# Patient Record
Sex: Male | Born: 1950 | Marital: Married
Health system: Southern US, Community
[De-identification: ages and names within clinical notes are randomized; demographics above are authoritative.]

---

## 2011-06-29 ENCOUNTER — Ambulatory Visit: Payer: Self-pay | Admitting: Emergency Medicine

## 2011-06-29 LAB — CBC WITH DIFFERENTIAL/PLATELET
Eosinophil %: 2.1 %
HCT: 35.7 % — ABNORMAL LOW (ref 40.0–52.0)
HGB: 11.9 g/dL — ABNORMAL LOW (ref 13.0–18.0)
Lymphocyte %: 27 %
MCHC: 33.5 g/dL (ref 32.0–36.0)
MCV: 92 fL (ref 80–100)
Monocyte #: 0.6 x10 3/mm (ref 0.2–1.0)
Neutrophil #: 5.2 10*3/uL (ref 1.4–6.5)
Neutrophil %: 62.8 %
Platelet: 259 10*3/uL (ref 150–440)
RBC: 3.86 10*6/uL — ABNORMAL LOW (ref 4.40–5.90)
WBC: 8.3 10*3/uL (ref 3.8–10.6)

## 2011-06-29 LAB — HEPATIC FUNCTION PANEL A (ARMC)
Albumin: 3.5 g/dL (ref 3.4–5.0)
Alkaline Phosphatase: 91 U/L (ref 50–136)
Bilirubin, Direct: <0.1 mg/dL (ref 0.00–0.20)
Bilirubin,Total: 0.2 mg/dL (ref 0.2–1.0)
SGOT(AST): 16 U/L (ref 15–37)
SGPT (ALT): 20 U/L
Total Protein: 7.3 g/dL (ref 6.4–8.2)

## 2011-07-02 ENCOUNTER — Inpatient Hospital Stay: Payer: Self-pay | Admitting: Emergency Medicine

## 2011-07-02 LAB — HEMOGLOBIN
HGB: 14 g/dL (ref 13.0–18.0)
HGB: 13.8 g/dL (ref 13.0–18.0)

## 2011-07-03 LAB — BASIC METABOLIC PANEL
Anion Gap: 8 (ref 7–16)
BUN: 32 mg/dL — ABNORMAL HIGH (ref 7–18)
Calcium, Total: 7.8 mg/dL — ABNORMAL LOW (ref 8.5–10.1)
Chloride: 111 mmol/L — ABNORMAL HIGH (ref 98–107)
Co2: 22 mmol/L (ref 21–32)
EGFR (Non-African Amer.): 21 — ABNORMAL LOW
Glucose: 87 mg/dL (ref 65–99)
Potassium: 5.3 mmol/L — ABNORMAL HIGH (ref 3.5–5.1)
Sodium: 141 mmol/L (ref 136–145)

## 2011-07-03 LAB — HEMOGLOBIN
HGB: 13.7 g/dL (ref 13.0–18.0)
HGB: 12.5 g/dL — ABNORMAL LOW (ref 13.0–18.0)

## 2011-07-04 LAB — CBC WITH DIFFERENTIAL/PLATELET
Basophil #: 0 10*3/uL (ref 0.0–0.1)
Basophil %: 0.2 %
Eosinophil #: 0 10*3/uL (ref 0.0–0.7)
HCT: 32.3 % — ABNORMAL LOW (ref 40.0–52.0)
HGB: 10.9 g/dL — ABNORMAL LOW (ref 13.0–18.0)
Lymphocyte %: 6.1 %
MCH: 31 pg (ref 26.0–34.0)
MCHC: 33.6 g/dL (ref 32.0–36.0)
MCV: 92 fL (ref 80–100)
Monocyte %: 9.1 %
Neutrophil #: 11.8 10*3/uL — ABNORMAL HIGH (ref 1.4–6.5)
Platelet: 190 10*3/uL (ref 150–440)
RBC: 3.51 10*6/uL — ABNORMAL LOW (ref 4.40–5.90)
WBC: 13.9 10*3/uL — ABNORMAL HIGH (ref 3.8–10.6)

## 2011-07-04 LAB — BASIC METABOLIC PANEL
Calcium, Total: 8.4 mg/dL — ABNORMAL LOW (ref 8.5–10.1)
Chloride: 107 mmol/L (ref 98–107)
Co2: 21 mmol/L (ref 21–32)
EGFR (African American): 45 — ABNORMAL LOW
EGFR (Non-African Amer.): 39 — ABNORMAL LOW
Sodium: 140 mmol/L (ref 136–145)

## 2011-07-05 LAB — BASIC METABOLIC PANEL
Calcium, Total: 8.7 mg/dL (ref 8.5–10.1)
Chloride: 107 mmol/L (ref 98–107)
Co2: 24 mmol/L (ref 21–32)
Creatinine: 1.21 mg/dL (ref 0.60–1.30)
EGFR (African American): >60
EGFR (Non-African Amer.): >60
Potassium: 3.9 mmol/L (ref 3.5–5.1)
Sodium: 140 mmol/L (ref 136–145)

## 2011-07-05 LAB — CBC WITH DIFFERENTIAL/PLATELET
Eosinophil %: 0.5 %
Lymphocyte %: 11.2 %
MCH: 30.5 pg (ref 26.0–34.0)
MCV: 92 fL (ref 80–100)
Monocyte #: 0.8 x10 3/mm (ref 0.2–1.0)
Neutrophil #: 9 10*3/uL — ABNORMAL HIGH (ref 1.4–6.5)
Neutrophil %: 80.9 %
Platelet: 184 10*3/uL (ref 150–440)
RBC: 3.02 10*6/uL — ABNORMAL LOW (ref 4.40–5.90)
RDW: 13.3 % (ref 11.5–14.5)
WBC: 11.2 10*3/uL — ABNORMAL HIGH (ref 3.8–10.6)

## 2011-07-08 LAB — PATHOLOGY REPORT

## 2011-07-09 LAB — CBC WITH DIFFERENTIAL/PLATELET
Basophil %: 0.5 %
Eosinophil %: 0.3 %
HCT: 28.6 % — ABNORMAL LOW (ref 40.0–52.0)
Lymphocyte #: 0.9 10*3/uL — ABNORMAL LOW (ref 1.0–3.6)
MCH: 30.5 pg (ref 26.0–34.0)
MCHC: 33.4 g/dL (ref 32.0–36.0)
MCV: 91 fL (ref 80–100)
Monocyte #: 1.4 x10 3/mm — ABNORMAL HIGH (ref 0.2–1.0)
Monocyte %: 12.5 %
Platelet: 398 10*3/uL (ref 150–440)
RBC: 3.13 10*6/uL — ABNORMAL LOW (ref 4.40–5.90)
RDW: 13.1 % (ref 11.5–14.5)
WBC: 10.9 10*3/uL — ABNORMAL HIGH (ref 3.8–10.6)

## 2011-07-09 LAB — BASIC METABOLIC PANEL
Anion Gap: 10 (ref 7–16)
BUN: 24 mg/dL — ABNORMAL HIGH (ref 7–18)
Calcium, Total: 8.5 mg/dL (ref 8.5–10.1)
Chloride: 105 mmol/L (ref 98–107)
EGFR (African American): >60
Glucose: 116 mg/dL — ABNORMAL HIGH (ref 65–99)
Sodium: 144 mmol/L (ref 136–145)

## 2011-07-12 LAB — CBC WITH DIFFERENTIAL/PLATELET
Basophil #: 0.1 10*3/uL (ref 0.0–0.1)
Basophil %: 0.6 %
Lymphocyte #: 1.2 10*3/uL (ref 1.0–3.6)
MCV: 91 fL (ref 80–100)
Monocyte %: 13.1 %
Neutrophil #: 7.1 10*3/uL — ABNORMAL HIGH (ref 1.4–6.5)
Neutrophil %: 72 %
Platelet: 464 10*3/uL — ABNORMAL HIGH (ref 150–440)
RBC: 2.94 10*6/uL — ABNORMAL LOW (ref 4.40–5.90)
WBC: 9.9 10*3/uL (ref 3.8–10.6)

## 2011-07-12 LAB — BASIC METABOLIC PANEL
Anion Gap: 8 (ref 7–16)
BUN: 13 mg/dL (ref 7–18)
Chloride: 110 mmol/L — ABNORMAL HIGH (ref 98–107)
Co2: 26 mmol/L (ref 21–32)
Creatinine: 1.05 mg/dL (ref 0.60–1.30)
EGFR (African American): >60
EGFR (Non-African Amer.): >60
Osmolality: 288 (ref 275–301)
Potassium: 2.7 mmol/L — ABNORMAL LOW (ref 3.5–5.1)
Sodium: 144 mmol/L (ref 136–145)

## 2011-08-29 LAB — COMPREHENSIVE METABOLIC PANEL
Albumin: 3 g/dL — ABNORMAL LOW (ref 3.4–5.0)
Alkaline Phosphatase: 101 U/L (ref 50–136)
Anion Gap: 7 (ref 7–16)
BUN: 17 mg/dL (ref 7–18)
Calcium, Total: 9 mg/dL (ref 8.5–10.1)
EGFR (African American): >60
EGFR (Non-African Amer.): 53 — ABNORMAL LOW
Glucose: 114 mg/dL — ABNORMAL HIGH (ref 65–99)
Osmolality: 289 (ref 275–301)
SGOT(AST): 17 U/L (ref 15–37)
Sodium: 144 mmol/L (ref 136–145)

## 2011-08-29 LAB — CBC WITH DIFFERENTIAL/PLATELET
Basophil #: 0 10*3/uL (ref 0.0–0.1)
Eosinophil #: 0 10*3/uL (ref 0.0–0.7)
HCT: 29.2 % — ABNORMAL LOW (ref 40.0–52.0)
HGB: 9.7 g/dL — ABNORMAL LOW (ref 13.0–18.0)
Lymphocyte #: 1.2 10*3/uL (ref 1.0–3.6)
MCV: 88 fL (ref 80–100)
Monocyte #: 0.8 x10 3/mm (ref 0.2–1.0)
Platelet: 427 10*3/uL (ref 150–440)
RBC: 3.31 10*6/uL — ABNORMAL LOW (ref 4.40–5.90)
WBC: 22 10*3/uL — ABNORMAL HIGH (ref 3.8–10.6)

## 2011-08-30 ENCOUNTER — Inpatient Hospital Stay: Payer: Self-pay | Admitting: Emergency Medicine

## 2011-08-30 LAB — URINALYSIS, COMPLETE
Bacteria: NONE SEEN
Blood: NEGATIVE
Glucose,UR: NEGATIVE mg/dL (ref 0–75)
Ketone: NEGATIVE
Ph: 5 (ref 4.5–8.0)
Protein: NEGATIVE
RBC,UR: <1 /HPF (ref 0–5)
Specific Gravity: 1.026 (ref 1.003–1.030)
Squamous Epithelial: NONE SEEN

## 2011-08-31 LAB — CLOSTRIDIUM DIFFICILE BY PCR

## 2011-09-01 LAB — CBC WITH DIFFERENTIAL/PLATELET
Basophil #: 0.1 10*3/uL (ref 0.0–0.1)
Eosinophil #: 0.2 10*3/uL (ref 0.0–0.7)
HCT: 24.2 % — ABNORMAL LOW (ref 40.0–52.0)
Lymphocyte #: 2.1 10*3/uL (ref 1.0–3.6)
MCHC: 33.7 g/dL (ref 32.0–36.0)
MCV: 89 fL (ref 80–100)
Monocyte #: 0.6 x10 3/mm (ref 0.2–1.0)
Neutrophil #: 4.8 10*3/uL (ref 1.4–6.5)
Platelet: 320 10*3/uL (ref 150–440)
RBC: 2.72 10*6/uL — ABNORMAL LOW (ref 4.40–5.90)
RDW: 15.9 % — ABNORMAL HIGH (ref 11.5–14.5)
WBC: 7.8 10*3/uL (ref 3.8–10.6)

## 2011-09-05 ENCOUNTER — Inpatient Hospital Stay: Payer: Self-pay | Admitting: Emergency Medicine

## 2011-09-05 LAB — COMPREHENSIVE METABOLIC PANEL
Bilirubin,Total: 0.4 mg/dL (ref 0.2–1.0)
Chloride: 98 mmol/L (ref 98–107)
Co2: 27 mmol/L (ref 21–32)
Creatinine: 1.81 mg/dL — ABNORMAL HIGH (ref 0.60–1.30)
EGFR (African American): 46 — ABNORMAL LOW
EGFR (Non-African Amer.): 39 — ABNORMAL LOW
Potassium: 3.1 mmol/L — ABNORMAL LOW (ref 3.5–5.1)
Sodium: 138 mmol/L (ref 136–145)

## 2011-09-05 LAB — URINALYSIS, COMPLETE
Blood: NEGATIVE
Glucose,UR: NEGATIVE mg/dL (ref 0–75)
Leukocyte Esterase: NEGATIVE
Nitrite: NEGATIVE
Ph: 5 (ref 4.5–8.0)
Protein: NEGATIVE

## 2011-09-05 LAB — BASIC METABOLIC PANEL
Anion Gap: 8 (ref 7–16)
Calcium, Total: 9.2 mg/dL (ref 8.5–10.1)
Co2: 29 mmol/L (ref 21–32)
Creatinine: 1.63 mg/dL — ABNORMAL HIGH (ref 0.60–1.30)
EGFR (African American): 52 — ABNORMAL LOW
EGFR (Non-African Amer.): 45 — ABNORMAL LOW
Glucose: 117 mg/dL — ABNORMAL HIGH (ref 65–99)
Potassium: 3.1 mmol/L — ABNORMAL LOW (ref 3.5–5.1)
Sodium: 138 mmol/L (ref 136–145)

## 2011-09-05 LAB — CBC
HCT: 37.4 % — ABNORMAL LOW (ref 40.0–52.0)
HGB: 12.3 g/dL — ABNORMAL LOW (ref 13.0–18.0)
MCHC: 32.9 g/dL (ref 32.0–36.0)
Platelet: 562 10*3/uL — ABNORMAL HIGH (ref 150–440)
RDW: 16.7 % — ABNORMAL HIGH (ref 11.5–14.5)
WBC: 15.3 10*3/uL — ABNORMAL HIGH (ref 3.8–10.6)

## 2011-09-05 LAB — MAGNESIUM: Magnesium: 1.8 mg/dL

## 2011-09-06 DIAGNOSIS — Z01810 Encounter for preprocedural cardiovascular examination: Secondary | ICD-10-CM

## 2011-09-06 LAB — BASIC METABOLIC PANEL
Calcium, Total: 8.6 mg/dL (ref 8.5–10.1)
Chloride: 103 mmol/L (ref 98–107)
Co2: 29 mmol/L (ref 21–32)
Creatinine: 1.27 mg/dL (ref 0.60–1.30)
Potassium: 3 mmol/L — ABNORMAL LOW (ref 3.5–5.1)
Sodium: 139 mmol/L (ref 136–145)

## 2011-09-09 LAB — CBC WITH DIFFERENTIAL/PLATELET
Basophil %: 1.1 %
Eosinophil #: 0.1 10*3/uL (ref 0.0–0.7)
Eosinophil %: 1.2 %
HGB: 8.3 g/dL — ABNORMAL LOW (ref 13.0–18.0)
Lymphocyte #: 2.1 10*3/uL (ref 1.0–3.6)
MCH: 29.2 pg (ref 26.0–34.0)
MCHC: 32.3 g/dL (ref 32.0–36.0)
MCV: 90 fL (ref 80–100)
Monocyte #: 1.3 x10 3/mm — ABNORMAL HIGH (ref 0.2–1.0)
Neutrophil #: 8.1 10*3/uL — ABNORMAL HIGH (ref 1.4–6.5)
Neutrophil %: 69.1 %
RDW: 16.3 % — ABNORMAL HIGH (ref 11.5–14.5)

## 2013-01-17 IMAGING — CT CT ABD-PELV W/ CM
1 of 2 series · 15 of 32 positions shown, 19 images · non-contrast
Comparison: none

REASON FOR EXAM: (1) abdominal pain, elevated wbc; (2) abdominal pain,
elevated wbc
COMMENTS:

PROCEDURE:     CT  - CT ABDOMEN / PELVIS  W  - August 30, 2011  [DATE]
RESULT:     History: Pain.
Comparison Study: Prior CT 07/12/2011 .

[Series 2: 3mm soft tissue · axial · 0.71mm/px · z∈[-1526,-1088]mm · 15 of 160 slices shown, 19 images]
[im 7/160  soft-tissue]
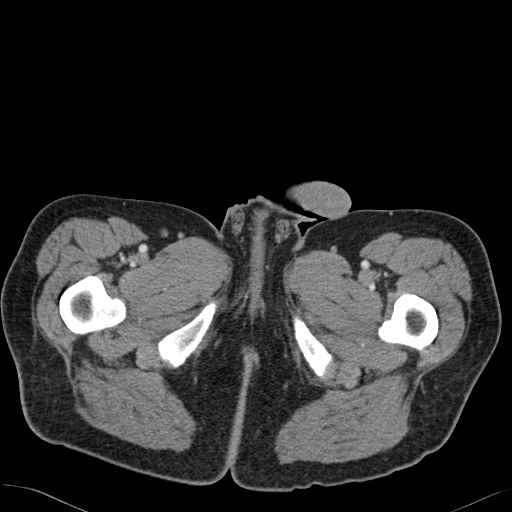
[im 7/160  bone]
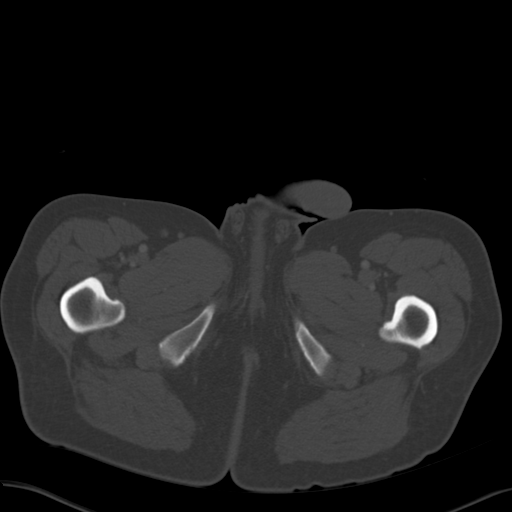
[im 20/160  soft-tissue]
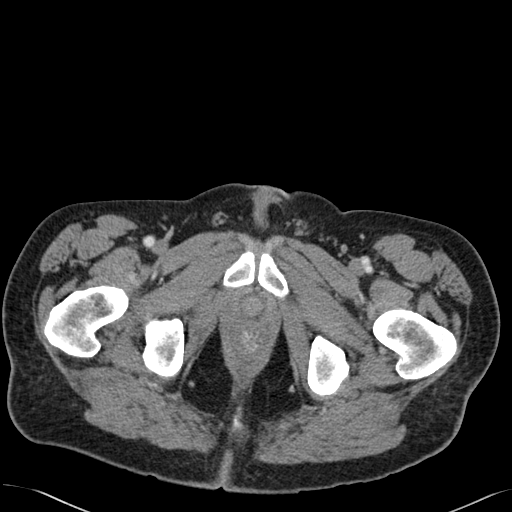
[im 32/160  soft-tissue]
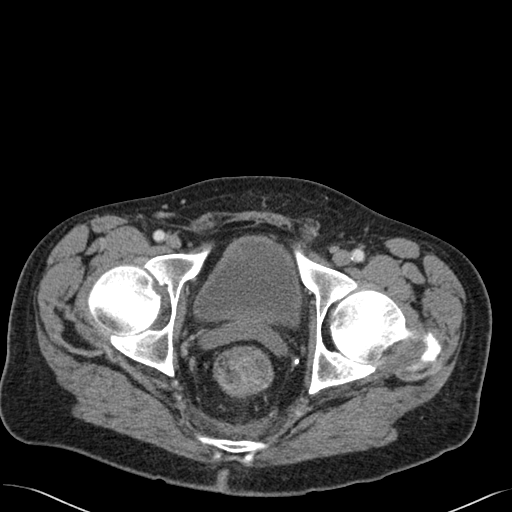
[im 45/160  soft-tissue]
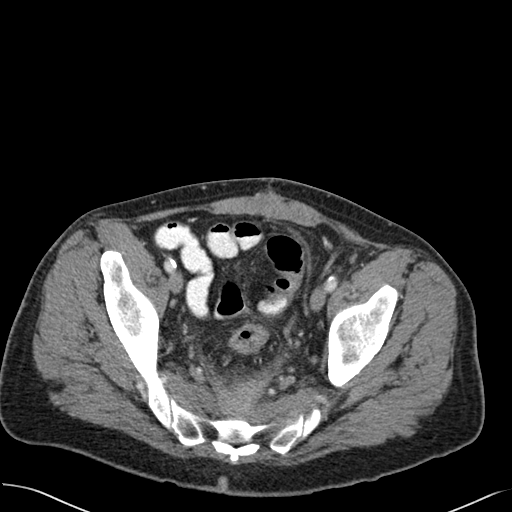
[im 58/160  soft-tissue]
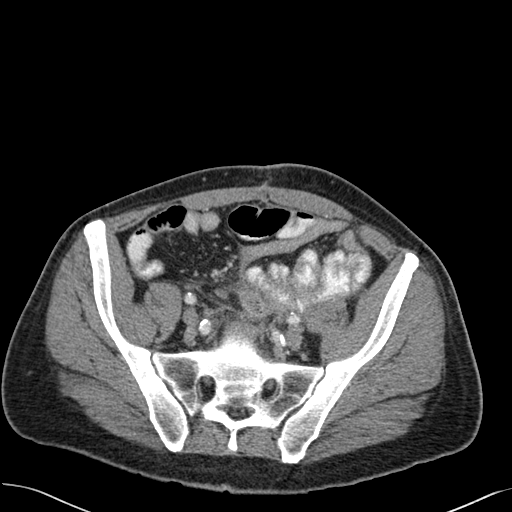
[im 70/160  soft-tissue]
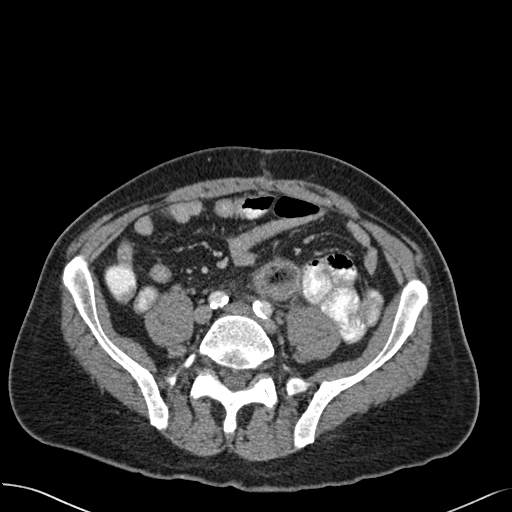
[im 83/160  soft-tissue]
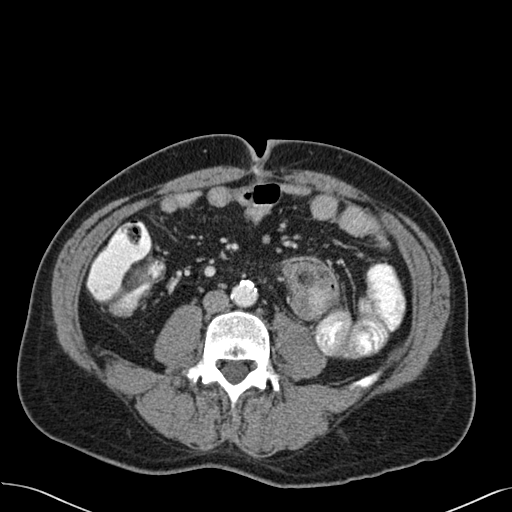
[im 90/160  soft-tissue]
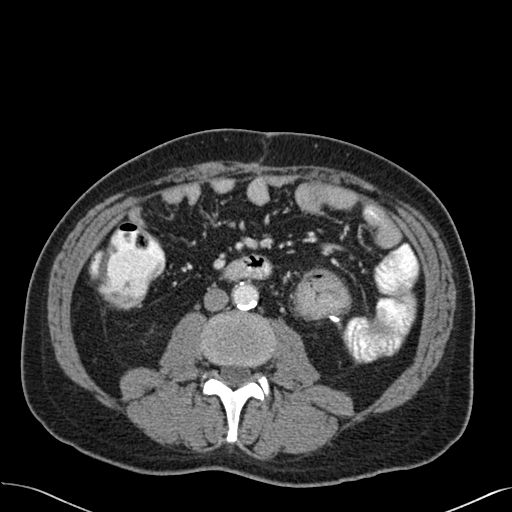
[im 102/160  soft-tissue]
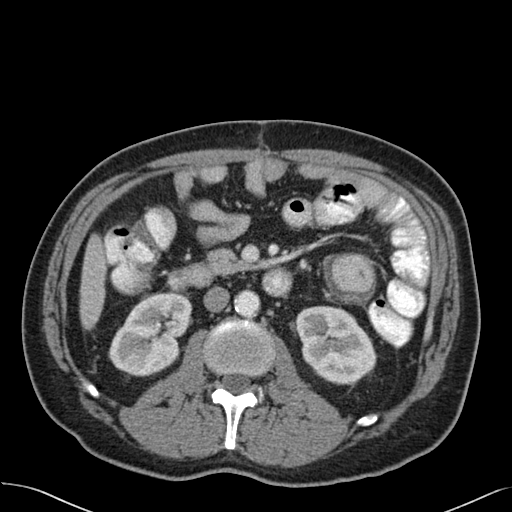
[im 102/160  bone]
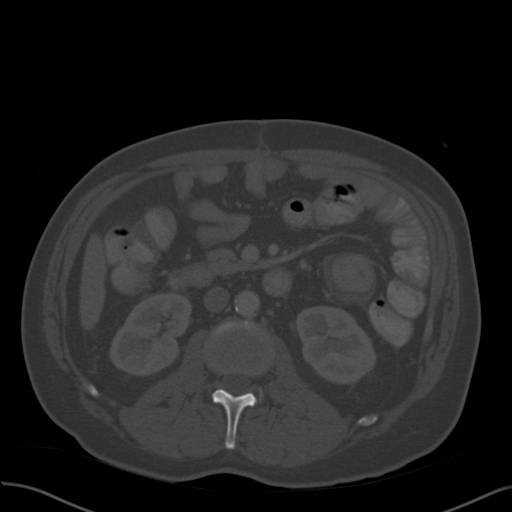
[im 115/160  soft-tissue]
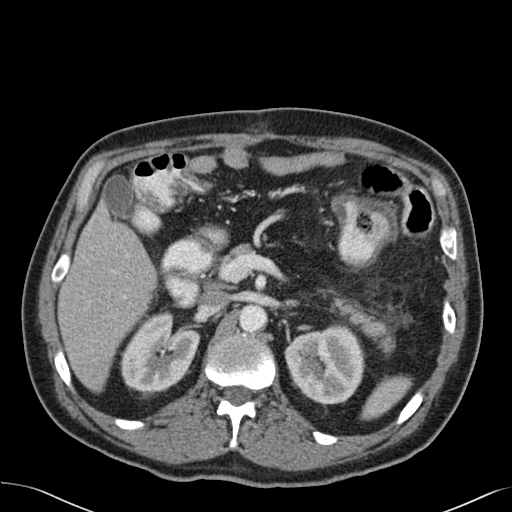
[im 128/160  soft-tissue]
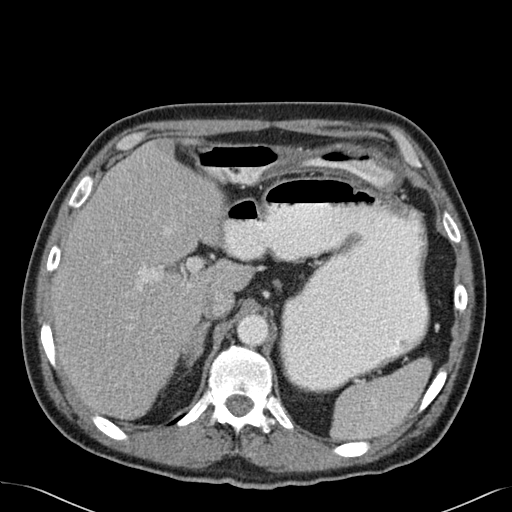
[im 134/160  lung]
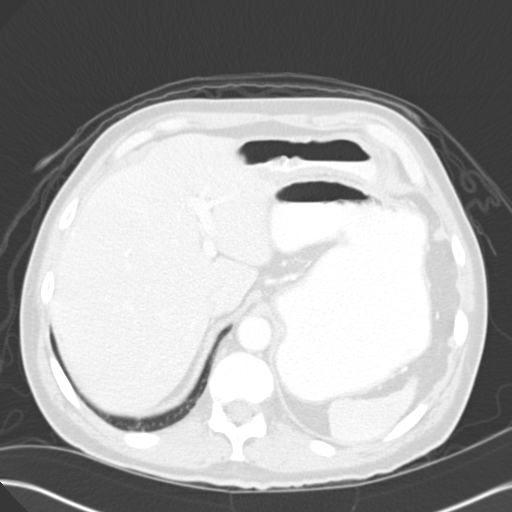
[im 140/160  soft-tissue]
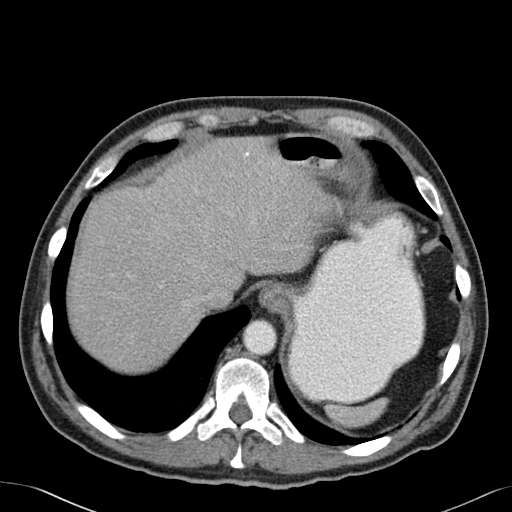
[im 140/160  lung]
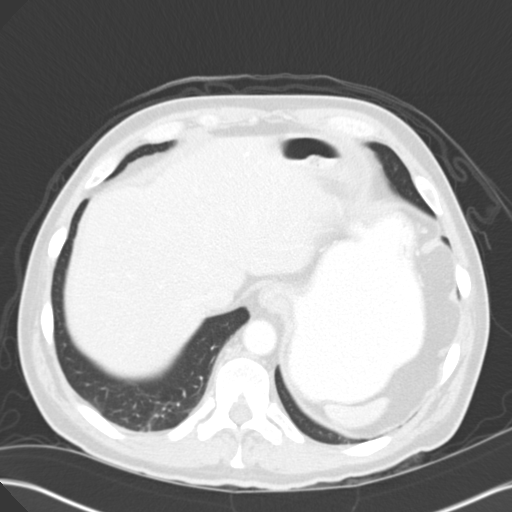
[im 147/160  lung]
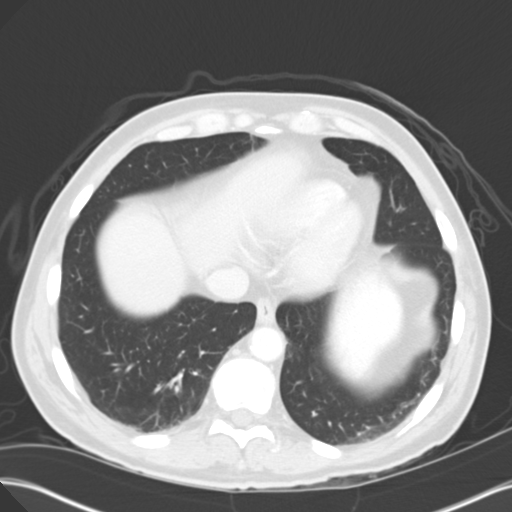
[im 153/160  soft-tissue]
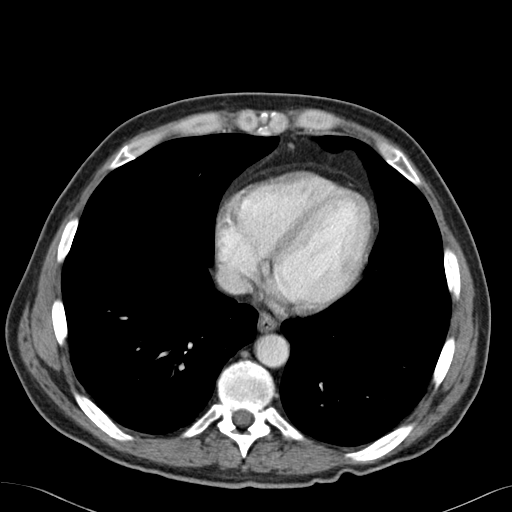
[im 153/160  lung]
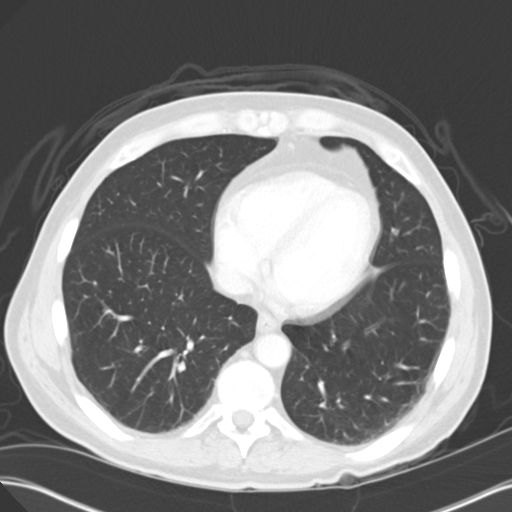

[15 of 32 positions shown; findings below may reference images not displayed]

FINDINGS: CT of the abdomen and pelvis obtained with 100 cc of Vsovue-[id number].
Liver spleen normal. Pancreas normal. Gallbladder nondistended. Mild adrenal
fullness suggesting hypertrophy present. Kidneys normal. Previously
identified left lower pelvic abscess has improved on prior study. Thickening
of the transverse colon and left colon wall noted. These changes suggest
colitis. Sigmoid colonic diverticulitis cannot be excluded. Mild thickening
of the wall of the sigmoid colon at the anastomosis noted. Sigmoid colonic
wall thickening at the level of abscess is thickened. No free fluid .
Phleboliths. Lung bases clear. No free air.
IMPRESSION: Interim improvement of left pelvic abscess. Colitis. The
findings suggesting colitis involving the left and transverse colon are new
from prior study. Pseudomembranous colitis should be considered. Continued
followup CT suggested.

## 2014-05-14 NOTE — Consult Note (Signed)
Chief Complaint:   Subjective/Chief Complaint abdominal pain and vomiting for 2 days   VITAL SIGNS/ANCILLARY NOTES: **Vital Signs.:   12-Aug-13 01:58   Vital Signs Type Routine   Temperature Temperature (F) 98.1   Celsius 36.7   Temperature Source Oral   Pulse Pulse 70   Respirations Respirations 18   Systolic BP Systolic BP 383   Diastolic BP (mmHg) Diastolic BP (mmHg) 69   Mean BP 81   Pulse Ox % Pulse Ox % 95   Pulse Ox Activity Level  At rest   Oxygen Delivery Room Air/ 21 %    05:54   Vital Signs Type Routine   Temperature Temperature (F) 97.7   Celsius 36.5   Temperature Source Oral   Pulse Pulse 67   Respirations Respirations 18   Systolic BP Systolic BP 291   Diastolic BP (mmHg) Diastolic BP (mmHg) 70   Mean BP 85   Pulse Ox % Pulse Ox % 95   Pulse Ox Activity Level  At rest   Oxygen Delivery Room Air/ 21 %    10:38   Vital Signs Type Q 4hr   Temperature Temperature (F) 98.2   Celsius 36.7   Temperature Source Oral   Pulse Pulse 62   Respirations Respirations 20   Systolic BP Systolic BP 916   Diastolic BP (mmHg) Diastolic BP (mmHg) 75   Mean BP 86   Pulse Ox % Pulse Ox % 98   Pulse Ox Activity Level  At rest   Oxygen Delivery Room Air/ 21 %  *Intake and Output.:   12-Aug-13 04:42   Grand Totals Intake:  1359 Output:  500    Net:  859 24 Hr.:  1009   Dietary Restrictions  NPO   IV (Primary)      In:  1359   NG Suction ml     Out:  500    Shift 07:00   Grand Totals Intake:  1359 Output:  500    Net:  859 24 Hr.:  1009   IV (Primary)      In:  1359   NG Suction ml     Out:  500   Length of Stay Totals Intake:  1509 Output:  500    Net:  1009    Daily 07:00   Grand Totals Intake:  1509 Output:  500    Net:  1009 24 Hr.:  1009   IV (Primary)      In:  6060   IV (Secondary)      In:  150   NG Suction ml     Out:  500   Length of Stay Totals Intake:  1509 Output:  500    Net:  1009    07:47   Grand Totals Intake:   Output:      Net:    89 Hr.:     Urinary Method  Void; BSC   Stool  large loose dark brown stool .    09:03   Grand Totals Intake:   Output:      Net:   24 Hr.:     Stool  medium sized amount of loose dark brown stool    10:37   Grand Totals Intake:   Output:      Net:   24 Hr.:     Stool  large amount of dark brown loose stool   Brief Assessment:   Cardiac Regular    Respiratory normal resp effort  Gastrointestinal details normal Soft  Nondistended    Additional Physical Exam pt had distended bowel and gastrograffin enema showede stricture of anastomosis and contained leak .colon is very distendid as compared to small bowel .i was debating between ileostomy but iam afraid if he does not decompress the colon and also he has lot small bowel loops stuck in pelvis will be hard to pull small bowel out.His appendex is also laying in the pelvis   Lab Results: Hepatic:  11-Aug-13 05:52    Bilirubin, Total 0.4   Alkaline Phosphatase 106   SGPT (ALT) 17   SGOT (AST) 21   Total Protein, Serum  8.6   Albumin, Serum 3.6  Routine Chem:  11-Aug-13 05:52    Glucose, Serum  131   BUN  6   Creatinine (comp)  1.81   Sodium, Serum 138   Potassium, Serum  3.1   Chloride, Serum 98   CO2, Serum 27   Calcium (Total), Serum  10.4   Anion Gap 13   Osmolality (calc) 275   eGFR (African American)  46   eGFR (Non-African American)  39 (eGFR values <59m/min/1.73 m2 may be an indication of chronic kidney disease (CKD). Calculated eGFR is useful in patients with stable renal function. The eGFR calculation will not be reliable in acutely ill patients when serum creatinine is changing rapidly. It is not useful in  patients on dialysis. The eGFR calculation may not be applicable to patients at the low and high extremes of body sizes, pregnant women, and vegetarians.)   Lipase 123 (Result(s) reported on 05 Sep 2011 at 06:47AM.)   Magnesium, Serum 1.8 (1.8-2.4 THERAPEUTIC RANGE: 4-7 mg/dL TOXIC: > 10 mg/dL   -----------------------)    09:28    Glucose, Serum  117   BUN 7   Creatinine (comp)  1.63   Sodium, Serum 138   Potassium, Serum  3.1   Chloride, Serum 101   CO2, Serum 29   Calcium (Total), Serum 9.2   Anion Gap 8   Osmolality (calc) 275   eGFR (African American)  52   eGFR (Non-African American)  45 (eGFR values <658mmin/1.73 m2 may be an indication of chronic kidney disease (CKD). Calculated eGFR is useful in patients with stable renal function. The eGFR calculation will not be reliable in acutely ill patients when serum creatinine is changing rapidly. It is not useful in  patients on dialysis. The eGFR calculation may not be applicable to patients at the low and high extremes of body sizes, pregnant women, and vegetarians.)    15:29    Result Comment ua specific gravity - Unable to perform testing due  - to interfering substance.  Result(s) reported on 05 Sep 2011 at 05:51PM.  Routine UA:  11-Aug-13 15:29    Color (UA) YELLOW   Clarity (UA) HAZY   Glucose (UA) NEGATIVE   Bilirubin (UA) NEGATIVE   Ketones (UA) NEGATIVE   Specific Gravity (UA) see comment   Blood (UA) NEGATIVE   pH (UA) 5.0   Protein (UA) NEGATIVE   Nitrite (UA) NEGATIVE   Leukocyte Esterase (UA) NEGATIVE   RBC (UA) 0-5   WBC (UA) 0-5   Bacteria (UA) 1+ (TRACE/FEW)   Epithelial Cells (UA) NONE SEEN  Result(s) reported on 05 Sep 2011 at 05:51PM.  Routine Hem:  11-Aug-13 05:52    WBC (CBC)  15.3   RBC (CBC)  4.26   Hemoglobin (CBC)  12.3   Hematocrit (CBC)  37.4   Platelet Count (  CBC)  562 (Result(s) reported on 05 Sep 2011 at 06:44AM.)   MCV 88   MCH 28.9   MCHC 32.9   RDW  16.7   Radiology Results: XRay:    11-Aug-13 06:37, Chest Portable Single View   Chest Portable Single View    REASON FOR EXAM:    severe abd pain, eval for free air  COMMENTS:       PROCEDURE: DXR - DXR PORTABLE CHEST SINGLE VIEW  - Sep 05 2011  6:37AM     RESULT: Comparison is made to the previous exam dated 30 August 2011.    Cardiac monitoring electrodes are present. The projection is lordotic.   The lungs are clear. The heart and pulmonary vessels are normal. The bony   and mediastinal structures are unremarkable. There is no effusion. There   is no pneumothorax or evidence of congestive failure.    IMPRESSION:  No acute cardiopulmonary disease.    Dictation Site: 6    Verified By: Sundra Aland, M.D., MD    12-Aug-13 12:03, Barium Enema Colon   Barium Enema Colon    REASON FOR EXAM:    rule out obstruction  COMMENTS:   LMP: (Male)    PROCEDURE: FL  - FL BARIUM ENEMA (COLON)  - Sep 06 2011 12:03PM     RESULT: Single contrast Gastrografin enema is performed. Correlation is   made with a recent CT dated 05 September 2011.    Limited opacification of the colon was attempted. The rectum and distal   sigmoid opacified fully. There is narrowing at the anastomotic junction   with evidence of extravasation of barium into a collection which likely   represents the areas of air are seen extending from the area of   anastomosis. This appears to anteriorly and toward the left. The optimal   colon from the anastomotic narrowing opacifies normally and distended in   appearance. Barium was drained from the sigmoid.  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 There was air seen in this fingerlike projection on the   recent CT. No definite free extravasation is identified. The findings   were called to the requesting surgeon at the time of dictation.    Dictation Site: 1(*)          Verified By: Sundra Aland, M.D., MD  CT:    11-Aug-13 11:28, CT Abdomen and Pelvis With Contrast   CT Abdomen and Pelvis With Contrast    REASON FOR EXAM:    (1) known pelvic ?abscess now presenting wi/   worseining abd pain; (2) same; rece  COMMENTS:       PROCEDURE: CT  - CT ABDOMEN /  PELVIS  W  - Sep 05 2011 11:28AM     RESULT: CT of the abdomen and pelvis is performed with oral contrast and   85 mL of Isovue-300 iodinated intravenous contrast with images   reconstructed at 3.0 mm slice thickness in the axial plane. Coronal 3 mm   reconstructions are also performed. Comparison is made to the previous   study of 30 August 2011.    Distended loops of small bowel and colon are present extending to the   area of anastomosis but appears to be present between images 115 and 124.   No discrete abscess is evident. There is a small amount of air just  proximal to what appears to bethe anastomosis. No definite abnormal   bowel wall thickening is evident. There is no pneumatosis. Surgical   consultation is recommended.    There is a abnormal appearance of the distal esophagus with evidence of   edema. Correlate for esophagitis. A hiatal hernia cannot be completely   excluded. The patient should benefit greatly from a nasogastric tube.    The liver, spleen, pancreas, kidneys, abdominal aorta and gallbladder   appear unremarkable. The adrenal glands are normal. The bony structures   are unremarkable. There is a small amount of free fluid in the abdomen.   The appendix is fluid-filled. The appendix extends to the area of   narrowing in the colon region as well.    IMPRESSION:   1. Evidence of obstruction in the distal colonin the area of presumed   anastomosis. There appears to be some desmoplastic response in this   region. Fibrosis from chronic inflammation or infection or underlying   malignant disease in the region are not excluded. Surgical consultation   is recommended. The appendix tip appears to be extending through this   region and care should be taken for that in surgical planning.  2. Abnormal appearance of the distal esophagus. The appearance suggests   esophagitis. This is new since the previous study. An NG tube would be   helpful the care should be taken  prior to placing the this is understood.  3. Scattered minimal ascites present within the abdomen and pelvis.    Dictation Site: 6(*)          Verified By: Sundra Aland, M.D., MD   Assessment/Plan:  Assessment/Plan:   Plan plan is do transverse colostomy to relieve the obstruction .i also thought of ileostomy but his small bowel is stuck in the pelvis in the contained leak.i did test anastomosis and there was no leak at the time of anastomosis.He does not have afrank leak but small blind track and no extravasation .only problem is stricture. i talked to him in detail and his brother   Electronic Signatures: Vella Kohler (MD)  (Signed 12-Aug-13 13:00)  Authored: Chief Complaint, VITAL SIGNS/ANCILLARY NOTES, Brief Assessment, Lab Results, Radiology Results, Assessment/Plan   Last Updated: 12-Aug-13 13:00 by Vella Kohler (MD)

## 2014-05-14 NOTE — Op Note (Signed)
PATIENT NAME:  Greg [name], Greg [name] MR#:  [id number] DATE OF BIRTH:  1950/02/11  DATE OF PROCEDURE:  09/07/2011  This patient has mostly cancer of the splenic flexure, also the rectum area, and multiple polyps on the left side of the colon, and underwent left extended colectomy with low rectal anastomosis with a stapler. With the transverse colon was hooked to the rectum and with testing during surgery there was really no leak at that time. The patient had a very long recovery from that surgery and was in the hospital for a long time. He was constantly vomiting, even with NG tube in, so finally he went home and went home for about two months with no problems. Now for the last two weeks he came back to the hospital twice with nausea and vomiting and CAT scan of the abdomen showed maybe the hook-up is stenosed so did a barium enema and we could see the stenosis of the rectum where the hook-up was, but there was slight leak at this area which was contained. Loops of bowel were all dilated and he had a midline incision. The trouble was what to do with it. I discussed the case with Kendell Bane and I thought personally that we could do ileostomy on him, temporary ileostomy on him, until decompressed and get settled in and latera on either resect the area and do dilatation of this area. So I talked the colon/rectal surgery at [facility], but the problem was when I reviewed the CAT scan with the radiologist his appendix was way down with the hook-up. His cecum and terminal ileum, everything was down in the cecum, so it was hard for me to bring the terminal ileum out. Also to recognize the terminal ileum, it would be much harder, because the cecum was not at its place. Also he had dilatation of the small bowel as well as the colon and there was some possibility that the colon might not decompress completely. In the hospital, after we did a Gastrografin enema, he started moving his bowels and he did move his bowels a lot, but  again when he goes home he has obstruction so I thought that it would be easier and after looking at the CT scan, he had transverse colon loop right in the right upper quadrant and it was easier to make a small incision to bring out the loop. That was the most difficult surgery ever had.   After the patient was put to sleep, I made a transverse incision in the right upper quadrant. After cutting skin and subcutaneous tissue, all the small bowel was stuck under the incision and the small bowel was dilated a lot, more than even the colon. The problem was to really identify the transverse colon, although because most of the transverse colon was hooked down into the hookup. I went to the hepatic flexure with a small incision, which I thought was ascending colon and the hepatic flexure, so I dissected off a lot of adhesions. The liver was stuck to it. Also the gallbladder was stuck to it. The cecum was stuck laterally as well as the small bowel was on top of the cecum so I went to the cecal area, to the ascending colon area, and placed up. The problem was difficulty in recognizing was it really the transverse colon that we were dealing with because there was no tenia visible and the small bowel also was very dilated. I immobilized quite a bit of the ascending colon and  the hepatic flexure area and I thought that could be the area so I was trying to put a finger across underneath to put a bridge to pull it up, but it so under tension and I made a hole in the bowel and a lot of biliary leakage came out. I suctioned it out. I closed the hole, but I did not know if it was the small bowel or the colon. At that time, I needed another opinion so I called Dr. Katrinka BlazingSmith to come and help me, so he came in and we both looked at this area and then we mobilized more bowel and he thought that it looks like this could be colon too and so the same area of the bowel, which had been mobilized, we put in a bridge underneath it and pulled it  up outside the abdomen and the fascia was closed around the colon and I sutured along the seromuscular wall of the colon with 0 Vicryl and 4-0 Vicryl sutures and then opened the opened the stoma and then sutured that to the skin. After this was done, the colon wall was very thickened and swollen and although I could put my finger proximally and distally. The problem was to get a nice seal on it because of the bridge because it was pretty long. So after that I put the longest-sized appliance over the ostomy, but still it was leaking fluid around the site, so at this time we just reinforced it and after a few days when the colon gets settled down I will pull out the bridge then he will be okay.   It was a difficult case because of his previous surgery and because of adhesions and because we wanted to decompress him so that he can eat and he can get stable enough to try to do the an anastomotic dilatation. At this time, since there was a contained leak, it was not possible to do any kind of anastomotic dilatation since it has only been six weeks to two months into the surgery. So we wanted to give him some time, so this was the reason to do the colostomy. It was a difficult case and I appreciate Dr. Michaelle CopasSmith's help in this case. After this was done, the areas of skin were closed with staple. The fascia was closed interruptedly with 0 Vicryl sutures around the colostomy so he does not pull out the colostomy.  ____________________________ Alton RevereMasud S. Cecelia ByarsHashmi, MD [email or web address] D: 09/07/2011 16:30:56 ET T: 09/08/2011 08:34:58 ET JOB#: [id number]  cc: Siona Coulston S. Cecelia ByarsHashmi, MD, <Dictator> Leanna SatoLinda M. Miles, MD Meryle ReadyMASUD S Cong Hightower MD ELECTRONICALLY SIGNED 09/11/2011 11:12

## 2014-05-14 NOTE — Consult Note (Signed)
Chief Complaint:   Subjective/Chief Complaint abdominal pain   VITAL SIGNS/ANCILLARY NOTES: **Vital Signs.:   05-Aug-13 06:00   Vital Signs Type Admission   Temperature Temperature (F) 98.1   Celsius 36.7   Pulse Pulse 72   Respirations Respirations 18   Systolic BP Systolic BP 782   Diastolic BP (mmHg) Diastolic BP (mmHg) 74   Mean BP 85   Pulse Ox % Pulse Ox % 99   Oxygen Delivery Room Air/ 21 %    09:40   Vital Signs Type Q 4hr   Temperature Temperature (F) 97.9   Celsius 36.6   Temperature Source Oral   Pulse Pulse 77   Respirations Respirations 18   Systolic BP Systolic BP 423   Diastolic BP (mmHg) Diastolic BP (mmHg) 69   Mean BP 81   Pulse Ox % Pulse Ox % 99   Pulse Ox Activity Level  At rest   Oxygen Delivery Room Air/ 21 %    14:26   Vital Signs Type Routine   Temperature Temperature (F) 97.9   Celsius 36.6   Temperature Source Oral   Pulse Pulse 62   Respirations Respirations 24   Systolic BP Systolic BP 536   Diastolic BP (mmHg) Diastolic BP (mmHg) 61   Mean BP 75   Pulse Ox % Pulse Ox % 98   Pulse Ox Activity Level  At rest   Oxygen Delivery Room Air/ 21 %  *Intake and Output.:   05-Aug-13 06:22   Grand Totals Intake:   Output:      Net:   23 Hr.:     Weight Type admission   Weight Method Bed   Current Weight (lbs) (lbs) 170.3   Current Weight (kg) (kg) 77.2   Height (ft) (feet) 6   Height (in) (in) 0   Height (cm) centimeters 182.8   BSA (m2) 1.9   BMI (kg/m2) 23.1    10:08   Grand Totals Intake:   Output:  850    Net:  -34 24 Hr.:  -850   Urine ml     Out:  850   Urinary Method  Foley    Shift 15:00   Grand Totals Intake:   Output:  850    Net:  -[place].:  -850   Urine ml     Out:  850   Length of Stay Totals Intake:   Output:  850    Net:  -850   Brief Assessment:   Cardiac Regular    Respiratory normal resp effort    Gastrointestinal details normal Soft  Nontender  Nondistended  No masses palpable  Bowel sounds  normal  No rebound tenderness  No gaurding  No rigidity    Gastrointestinal details abnormal Minimum Tenderness    Tenderness none   Lab Results: Hepatic:  04-Aug-13 23:20    Bilirubin, Total 0.3   Alkaline Phosphatase 101   SGPT (ALT) 18   SGOT (AST) 17   Total Protein, Serum 7.3   Albumin, Serum  3.0  Routine Chem:  04-Aug-13 23:20    Glucose, Serum  114   BUN 17   Creatinine (comp)  1.41   Sodium, Serum 144   Potassium, Serum  3.3   Chloride, Serum  109   CO2, Serum 28   Calcium (Total), Serum 9.0   Osmolality (calc) 289   eGFR (African American) >60   eGFR (Non-African American)  53 (eGFR values <43m/min/1.73 m2 may be an indication of  chronic kidney disease (CKD). Calculated eGFR is useful in patients with stable renal function. The eGFR calculation will not be reliable in acutely ill patients when serum creatinine is changing rapidly. It is not useful in  patients on dialysis. The eGFR calculation may not be applicable to patients at the low and high extremes of body sizes, pregnant women, and vegetarians.)   Anion Gap 7   Lipase 154 (Result(s) reported on 29 Aug 2011 at 11:48PM.)  Routine UA:  05-Aug-13 10:19    Color (UA) Yellow   Clarity (UA) Hazy   Glucose (UA) Negative   Bilirubin (UA) Negative   Ketones (UA) Negative   Specific Gravity (UA) 1.026   Blood (UA) Negative   pH (UA) 5.0   Protein (UA) Negative   Nitrite (UA) Negative   Leukocyte Esterase (UA) Negative (Result(s) reported on 30 Aug 2011 at 10:49AM.)   RBC (UA) <1 /HPF   WBC (UA) NONE SEEN   Bacteria (UA) NONE SEEN   Epithelial Cells (UA) NONE SEEN  Result(s) reported on 30 Aug 2011 at 10:49AM.  Routine Hem:  04-Aug-13 23:20    WBC (CBC)  22.0   RBC (CBC)  3.31   Hemoglobin (CBC)  9.7   Hematocrit (CBC)  29.2   Platelet Count (CBC) 427   MCV 88   MCH 29.3   MCHC 33.2   RDW  15.6   Neutrophil % 90.8   Lymphocyte % 5.3   Monocyte % 3.8   Eosinophil % 0.0   Basophil % 0.1    Neutrophil #  20.0   Lymphocyte # 1.2   Monocyte # 0.8   Eosinophil # 0.0   Basophil # 0.0 (Result(s) reported on 29 Aug 2011 at 11:33PM.)   Radiology Results: XRay:    05-Aug-13 04:35, Chest PA and Lateral   Chest PA and Lateral    REASON FOR EXAM:    Leukocytosis,  PLEASE COMPLETE PRIOR TO TRANSFER FROM   ED TO THE FLOOR.  COMMENTS:       PROCEDURE: DXR - DXR CHEST PA (OR AP) AND LATERAL  - Aug 30 2011  4:35AM     RESULT: Comparison is made to the study of 29 June 2011.    The lungs are clear. The heart and pulmonary vessels are normal. The bony   and mediastinal structures are unremarkable. There is no effusion. There   is no pneumothorax or evidence of congestive failure.    IMPRESSION:  No acute cardiopulmonary disease.    Dictation Site: 2    Verified By: Sundra Aland, M.D., MD  CT:    05-Aug-13 02:39, CT Abdomen and Pelvis With Contrast   CT Abdomen and Pelvis With Contrast    REASON FOR EXAM:    (1) abdominal pain, elevated wbc; (2) abdominal pain,   elevated wbc  COMMENTS:       PROCEDURE: CT  - CT ABDOMEN / PELVIS  W  - Aug 30 2011  2:39AM     RESULT: History: Pain.    Comparison Study: Prior CT 07/12/2011 .    Findings:CT of the abdomen and pelvis obtained with 100 cc of   Isovue-300. Liver spleen normal. Pancreas normal. Gallbladder   nondistended. Mild adrenal fullness suggesting hypertrophy present.   Kidneys normal. Previously identified left lower pelvic abscess has   improved on prior study. Thickening of the transverse colon and left     colon wall noted. These changes suggest colitis. Sigmoid colonic  diverticulitis cannot be excluded. Mild thickening of the wall of the   sigmoid colon at the anastomosis noted. Sigmoid colonic wall thickening   at the level of abscess is thickened. No free fluid . Phleboliths. Lung   bases clear. No free air.    IMPRESSION:  Interim improvement of left pelvic abscess. Colitis. The   findings suggesting  colitis involving the left and transverse colon are   new from prior study. Pseudomembranous colitis should be considered.   Continued followup CT suggested.          Verified By: Osa Craver, M.D., MD   Assessment/Plan:  Invasive Device Daily Assessment of Necessity:   Does the patient currently have any of the following indwelling devices? none   Assessment/Plan:   Assessment This patient who had extended left colectomy done with transverse to rectal anastomosis done about 6 weeks ago came last night with abdominal pain i discussed the Ct scan with Dr Burt Knack today for drainage of abcess if any but he said there is none to be drained. i just wonder that he might have C Difficile colitis    Plan will do stools for C Difficile and add flagyl for now   Electronic Signatures: Vella Kohler (MD)  (Signed 05-Aug-13 16:59)  Authored: Chief Complaint, VITAL SIGNS/ANCILLARY NOTES, Brief Assessment, Lab Results, Radiology Results, Assessment/Plan   Last Updated: 05-Aug-13 16:59 by Vella Kohler (MD)

## 2014-05-14 NOTE — H&P (Signed)
PATIENT NAME:  Greg [name], Tlaloc D MR#:  [id number] DATE OF BIRTH:  [birth year]  DATE OF ADMISSION:  09/05/2011  HISTORY OF PRESENT ILLNESS: This 64 year old male came into the Emergency Room with chief complaint of lower abdominal pain. He says it has been hurting for several days and gradually increasing in severity and has been intermittent. He also reports nausea and vomiting; no chills or fever. He has been voiding satisfactorily at home. He had some bowel movement yesterday and also passed some gas yesterday, but no bowel movement or flatus today.  He had recently had surgery on 07/02/2011. He had resection of the distal transverse and sigmoid colon with low pelvic anastomosis. He had two cancers. The more extensive cancer was in the distal sigmoid colon and did penetrate through the muscularis propria into the pericolonic fat. There was no cancer found in 16 regional mesenteric lymph nodes. Margins were clear. The patient was discharged on 07/17/2011 and had some difficulty with either ileus or bowel obstruction but gradually improved and was discharged.   He was later readmitted on 08/30/2011 and appeared that he likely had colitis, was negative for C. difficile. He was discharged on Augmentin and Flagyl. He went on 09/01/2011.  The patient reports since his discharge he had been taking his diet; however, he subsequently developed this abdominal pain in the last few days gradually increasing and now came into the Emergency Room.   He was initially evaluated by the emergency room staff and found to be hypotensive and appeared to be dehydrated. He was given some 2 liters of intravenous IV fluid. He also had CT scan including oral and IV contrast. I have reviewed those images which do demonstrate marked distention of the colon and small bowel and gastric distention. It appears there is distention that extends down to the pelvic anastomosis. Also appears that there is some stool with minimal amount of gas  in the rectum distal to the anastomosis. There is no evidence of any abscess formation.   PAST MEDICAL HISTORY: He has hypertension and has been on treatment. The patient reports no history of heart disease, lung disease, hepatitis, and no history of diabetes.   PAST SURGICAL HISTORY: The above noted resection of distal transverse colon, descending and sigmoid colon on 07/02/2011. Did have stents inserted prior to surgery into his ureters.   SOCIAL HISTORY: He typically smokes a pack of cigarettes per day, but in recent days has gotten down to about four cigarettes per day and trying to stop. He does not drink any alcohol.   FAMILY HISTORY: Positive for diabetes.   REVIEW OF SYSTEMS: He reports no recent acute illness such as cough, cold, or sore throat. He reports no recent visual or auditory changes. No recent sores or boils. No ankle edema. He reports no chest pain. No irregular heartbeat. No dyspnea. No cough. Does report some heartburn along with the vomiting. Reports no neurologic symptoms. Review of systems is otherwise negative.   PHYSICAL EXAMINATION:   GENERAL: He was seen in some moderate distress on the ER stretcher receiving IV fluids. He has an indwelling nasogastric tube which is draining some brownish, thin bile stained gastric contents. No blood was seen   VITAL SIGNS: Temperature 96.6, initial pulse 113, respirations 22, and blood pressure 94/54.   SKIN: Warm and dry without rash.   HEENT: Pupils are equal and reactive to light. Extraocular movements are intact. Sclerae clear. Pharynx clear.   NECK: No palpable mass.   LUNGS: Lungs sounds  are clear. No respiratory distress.   HEART: Regular rhythm, S1 and S2.   ABDOMEN: Distended, tympanitic, soft with minimal degree of tenderness. No guarding. Well-healed long midline abdominal incision.   RECTAL: Rectal exam demonstrates some moderate discomfort with insertion of a finger. There was some gas and some soft stool  within the rectum. No palpable rectal mass.   EXTREMITIES: No dependent edema.   NEUROLOGIC: Awake, alert, and oriented, moving all extremities.   CLINICAL DATA: His initial creatinine was 1.81 and then after IV hydration creatinine decreased to 1.63. His potassium is 3.1. Liver panel with an albumin of 3.6. His white blood count is 15,300. Hemoglobin is 12.3 although I see it was 8.2 on 09/01/2011. This suggests there may be some hemoconcentration. His platelet count is 562,000.   IMPRESSION:  1. Colonic obstruction.  2. Dehydration.  3. Anemia.  4. History of colon cancer.  5. Hypokalemia.   RECOMMENDATIONS: I recommended admission to the hospital, continued nasogastric suction, and IV fluids. The patient has not passed urine yet but appears that there is urine in the bladder, as seen on CT scan. We will see if the patient can stand to void. If he cannot void, may need a Foley urinary catheter.   I see that he was being treated with Augmentin and Flagyl so we will continue same type medicine; we will give intravenous Unasyn and Flagyl. Recheck his CBC tomorrow. We will replace potassium.   I am going to notify Dr. Jovita Gamma tomorrow that the patient is in hospital. ____________________________ J. Renda Rolls, MD jws:slb D: 09/05/2011 14:37:05 ET T: 09/05/2011 15:27:54 ET JOB#: [id number]  cc: Adella Hare, MD, <Dictator> Masud S. Cecelia Byars, MD Adella Hare MD ELECTRONICALLY SIGNED 09/08/2011 12:34

## 2014-05-14 NOTE — H&P (Signed)
PATIENT NAME:  Greg [name], Greg [name] MR#:  [id number] DATE OF BIRTH:  1950-09-23  DATE OF ADMISSION:  09/05/2011  ADDENDUM: Completion of History and Physical.  MEDICATIONS:  1. Hemex 1 tablet per day. 2. Augmentin 875 mg twice a day. 3. Flagyl 500 mg twice a day. 4. Lisinopril/hydrochlorothiazide 20/25 mg daily.  5. Oxycodone with acetaminophen p.r.n. for pain. 6. Doc-Q-Lax 50/8.6 one tablet daily.   DRUG ALLERGIES: Vicodin. ____________________________ J. Renda RollsWilton Smith, MD jws:slb [name]: 09/05/2011 14:38:43 ET T: 09/05/2011 15:09:52 ET JOB#: [id number]  cc: Greg HareJ. Greg Smith, MD, <Dictator> Greg HareWILTON J SMITH MD ELECTRONICALLY SIGNED 09/08/2011 12:34

## 2014-05-19 NOTE — Op Note (Signed)
PATIENT NAME:  Arlana PouchKIMBER, Donnis D MR#:  [id number] DATE OF BIRTH:  11-Mar-1950  DATE OF PROCEDURE:  07/02/2011  PREOPERATIVE DIAGNOSIS: Colon cancer.  POSTOPERATIVE DIAGNOSIS: Colon cancer.   PROCEDURE PERFORMED:   1. Cystoscopy with bilateral stent placement.  2. Fluoroscopy.   SURGEON: Anola GurneyMichael Jawan Chavarria, MD   ANESTHETIST: Margot AblesG. Van Staveren, MD   ANESTHESIA: General.   INDICATIONS: See the dictated History and Physical. After informed consent, the patient requested the above procedure.   OPERATIVE SUMMARY: After adequate general anesthesia had been attained, the patient was placed into the dorsal lithotomy position and the perineum was prepped and draped in the usual fashion. The 21-French cystoscope was coupled with the camera and then visually advanced into the bladder. The patient had minimal benign prostatic hypertrophy. No bladder tumors were identified. Both ureteral orifices were identified and had clear efflux. Using C-arm fluoroscopy, a 0.035 Glidewire was advanced up the right ureteral orifice to the renal pelvis. An 8-French  open-ended ureteral catheter was then advanced over the guidewire and positioned to the level of the renal pelvis using fluoroscopic guidance. The guidewire was then removed, taking care to leave the stent in position. The procedure was repeated in an identical fashion on the left side. At this point, the cystoscope was removed taking care to leave the stent in position. A 20-French silicone catheter was placed, and the ureteral catheters were connected to the Foley with the connecting system. This portion of the procedure was then terminated. The patient tolerated this portion of the procedure well.  ____________________________ Suszanne ConnersMichael R. Evelene CroonWolff, MD mrw:cbb D: 07/02/2011 08:42:28 ET T: 07/02/2011 12:07:10 ET JOB#: [id number] Burnette Valenti R Shuntae Herzig MD ELECTRONICALLY SIGNED 07/03/2011 11:01

## 2014-05-19 NOTE — Op Note (Signed)
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   PROCEDURES PERFORMED: Extended left hemicolectomy, dissection of sigmoid colon and descending colon as well as the splenic flexure and hooking the transverse colon down to the rectum with low rectal anastomosis.   SURGEON: Greg GammaMasud Treavon Castilleja, MD  INDICATIONS: This patient was seen by my office. He had a colonoscopy done and that showed multiple lesions and multiple polyps in the left colon. One lesion was in the rectal sigmoid at about only 15 to 20 cm from the rectum and there were multiple polyps.   We did polypectomy and those polyps were benign, but this  rectal tumor was quite necrotic and then in the splenic flexure there was another tumor. I inked it because that also looked infiltrated.   DESCRIPTION OF PROCEDURE: The patient was then brought to surgery. Just before surgery, I did colonoscopy and I looked at the rectal tumor. It was inked. I then went up and looked at the splenic flexure again and there was a tumor there and it was inked too. I looked to the transverse colon. I wanted to look at anything else in the middle, but I did not see anything else. So I took out the scope and decided to remove the left colon. I asked Greg [name] to help me and he came back to help me.   Under general anesthesia, the abdomen was then prepped and draped. A large incision was made all the way from the symphysis pubis to the suprapubic area and after cutting skin and subcutaneous tissue the fascia was then cut, the abdomen was entered. The patient had a lot of air in the colon due to the colonoscopy, but by palpating I could hardly feel the rectal tumor, but I could not feel a transverse colon tumor. We mobilized the cecum  and transverse colon all the way to the splenic flexure. The splenic flexure was very, very high and with using the Harmonic scalpel I was able to take down the splenic flexure with a lot of difficulty. After we took down the splenic flexure, I could see the tumor which was in the distal transverse colon. Then the sigmoid colon was brought down. There was no bleeding from the spleen but there was a lot of oozing and bleeding from the surface. The ureteral catheters really helped me. After mobilizing the rectosigmoid and also going below into the hollow of the sacrum, I pulled the rectum up and I also made an incision medially on the mesocolon and going towards the bladder and pushed the bladder up and pulled out most of the rectum up high just below the inked part, which I had used before. After that we transected the rectum with the stapler and then took out the whole left colon and down to the splenic flexure making sure the mesocolon was then clamped, cut, and tied right and watching the ureter all the time. It was a difficult dissection because of the size of the patient and because of a lot of fat and because of the very, very high splenic flexure. After that was done, the anastomosis was then performed with the rectum to the transverse colon with the EEA which was put in from the rectum and an anvil was put  in the transverse colon. A good anastomosis was done. Irrigation of the rectum was performed. There was no leak from this. After this was done, I irrigated the abdomen thoroughly. There was one area in the right upper quadrant of the abdomen near the duodenal sweep where the patient had a little bleeding point which was then clipped and had a lot of blood in the right upper quadrant of the abdomen which was suctioned out and washed out. I did not see anything else or bleeding. A piece of Surgicel was put on this bleeding site also. I came back and it looked dry and it looked dry everywhere else so then  after that I closed the abdomen in routine fashion. The fascia was closed with running #1 PDS loop suture and also two sutures were used, one from the bottom and the other one from the top, and meeting in the middle. The skin was closed with staples. The ureteral catheter was then removed. The patient tolerated the procedure well and was sent to the recovery room in satisfactory condition. We thought that we could give some blood to this patient in surgery because we did lose quite a bit of blood and he had quite a bit of left colon removed, so he is going to be given 2 units of blood at this time.   ____________________________ Greg Revere Cecelia Byars, MD Greg [name]: 07/02/2011 13:14:10 ET T: 07/02/2011 13:31:04 ET JOB#: [id number]  cc: Greg Kirby S. Cecelia Byars, MD, <Dictator> Greg Ready MD ELECTRONICALLY SIGNED 07/07/2011 13:09

## 2017-11-30 ENCOUNTER — Telehealth: Payer: Self-pay | Admitting: *Deleted

## 2017-11-30 NOTE — Telephone Encounter (Signed)
Received referral for low dose lung cancer screening CT scan.  Unable to leave message at this time, will attempt call at later point.       

## 2017-12-07 ENCOUNTER — Telehealth: Payer: Self-pay | Admitting: *Deleted

## 2017-12-07 NOTE — Telephone Encounter (Signed)
Received referral for low dose lung cancer screening CT scan. Message left at phone number listed in EMR for patient to call me back to facilitate scheduling scan.  

## 2017-12-14 ENCOUNTER — Telehealth: Payer: Self-pay | Admitting: *Deleted

## 2017-12-14 NOTE — Telephone Encounter (Signed)
Attempted to contact patient to schedule lung screening scan. However, there is no answer and no voicemail option. Will mail notification.

## 2017-12-21 ENCOUNTER — Encounter: Payer: Self-pay | Admitting: *Deleted
